# Patient Record
Sex: Female | Born: 1980 | Race: Black or African American | Hispanic: No | Marital: Single | Smoking: Never smoker
Health system: Southern US, Community
[De-identification: ages and names within clinical notes are randomized; demographics above are authoritative.]

---

## 2013-10-07 ENCOUNTER — Encounter: Payer: Self-pay | Admitting: Podiatry

## 2013-10-07 ENCOUNTER — Ambulatory Visit (INDEPENDENT_AMBULATORY_CARE_PROVIDER_SITE_OTHER): Payer: 59 | Admitting: Podiatry

## 2013-10-07 ENCOUNTER — Ambulatory Visit (INDEPENDENT_AMBULATORY_CARE_PROVIDER_SITE_OTHER): Payer: 59

## 2013-10-07 VITALS — BP 130/88 | HR 68 | Resp 14 | Ht 65.0 in | Wt 182.0 lb

## 2013-10-07 DIAGNOSIS — M779 Enthesopathy, unspecified: Secondary | ICD-10-CM

## 2013-10-07 MED ORDER — MELOXICAM 15 MG PO TABS: 15.0000 mg | ORAL_TABLET | Freq: Every day | ORAL | Status: AC

## 2013-10-07 MED ORDER — METHYLPREDNISOLONE (PAK) 4 MG PO TABS: ORAL_TABLET | ORAL | Status: AC

## 2013-10-07 NOTE — Progress Notes (Signed)
   Subjective:    Patient ID: Nicole Mcguire, female    DOB: 30-Jun-1980, 33 y.o.   MRN: [id number]  HPI Comments: Pt states she has had posterior left heel pain for 3 months, and that it is worse in the morning.  Pt states she has occasionally used ice and Ibuprofen for comfort.     Review of Systems  HENT: Positive for sinus pressure and trouble swallowing.   All other systems reviewed and are negative.      Objective:   Physical Exam: I have reviewed his past mental history medications allergies surgeries social history and review of systems. Pulses are strongly palpable bilateral. Deep tendon reflexes are intact bilateral. Neurologic sensorium is intact per since once the monofilament. Muscle strength is 5 over 5 dorsiflexors plantar flexors inverters everters all of his musculature is intact. Orthopedic evacuation demonstrates pain on palpation of the posterior aspect of the left heel at the tendo Achilles insertion site. There is no erythema edema saline is drainage or odor in this area. She has tenderness on palpation at the distal most insertion site of the Achilles. Radiographic evaluation demonstrates small retrocalcaneal heel spur and thickening of the tendo Achilles.        Assessment & Plan:  Assessment: Achilles tendinitis left.  Plan: Discussed etiology pathology conservative versus surgical therapies. Injected the area today with dexamethasone and local anesthetic 2 mg was utilized. I placed her in a Cam Walker for daytime use and a night splint for bedtime use. I wrote a prescription for Medrol Dosepak to be followed by meloxicam. And I will followup with her in one month.

## 2013-11-04 ENCOUNTER — Ambulatory Visit (INDEPENDENT_AMBULATORY_CARE_PROVIDER_SITE_OTHER): Payer: 59 | Admitting: Podiatry

## 2013-11-04 ENCOUNTER — Encounter: Payer: Self-pay | Admitting: Podiatry

## 2013-11-04 VITALS — BP 128/82 | HR 74 | Resp 12

## 2013-11-04 DIAGNOSIS — M7660 Achilles tendinitis, unspecified leg: Secondary | ICD-10-CM

## 2013-11-05 NOTE — Progress Notes (Signed)
She presents today wearing her Cam Dan HumphreysWalker and states that after all the medicine and all other conservative therapies she can states that she's only approximately 40% better. She states that she has done everything that we have asked her to do an attempt to better herself.  Objective: Vital signs are stable she is alert and oriented x3. With the Cam Dan HumphreysWalker was removed she still has pain on palpation of the posterior aspect of the tendo Achilles as it inserts just on the superiormost portion of the calcaneus posteriorly. There is mild erythema and warmth on palpation.  Assessment: Insertional Achilles tendinitis left heel. Possible split tear or transverse tear of the Achilles.  Plan: She was scheduled for an MRI of this left heel. Should this come back with simple Achilles tendinitis physical therapy will be indicated otherwise a tear would indicate surgical necessity.

## 2013-11-08 ENCOUNTER — Ambulatory Visit
Admission: RE | Admit: 2013-11-08 | Discharge: 2013-11-08 | Disposition: A | Discharge status: Home / Self Care | Payer: 59 | Source: Ambulatory Visit | Attending: Podiatry | Admitting: Podiatry

## 2013-11-08 DIAGNOSIS — M7660 Achilles tendinitis, unspecified leg: Secondary | ICD-10-CM

## 2013-11-10 ENCOUNTER — Telehealth: Payer: Self-pay | Admitting: *Deleted

## 2013-11-10 NOTE — Telephone Encounter (Signed)
Spoke to patient , explained to her that dr Al Corpushyatt wanted another read on the mri, explain process and told patient we will call her as soon as we get results

## 2013-11-11 ENCOUNTER — Telehealth: Payer: Self-pay | Admitting: *Deleted

## 2013-11-11 NOTE — Telephone Encounter (Signed)
I called and left her a message that Dr. Al CorpusHyatt got her MRI results back.  He wants to get a second opinion from another Radiologist, so we are sending the disk to [facility].  We will call you once we get the results.

## 2013-11-11 NOTE — Telephone Encounter (Signed)
[facility] disk and results sent to us.  She tried to connect me to medical records at [address] location but they didn't answer.  She said she would give them the message.

## 2013-11-22 ENCOUNTER — Telehealth: Payer: Self-pay | Admitting: *Deleted

## 2013-11-22 NOTE — Telephone Encounter (Signed)
MRI disk and results were sent to [facility] for a second opinion.  [facility] [address]  [place], [place] [address]

## 2013-11-26 ENCOUNTER — Encounter: Payer: Self-pay | Admitting: Podiatry

## 2013-11-26 ENCOUNTER — Telehealth: Payer: Self-pay

## 2013-11-26 NOTE — Telephone Encounter (Signed)
Left message for patient to schedule an appointment for her MRI results.

## 2013-12-09 ENCOUNTER — Ambulatory Visit: Payer: 59 | Admitting: Podiatry

## 2013-12-16 ENCOUNTER — Ambulatory Visit: Payer: 59 | Admitting: Podiatry

## 2013-12-28 ENCOUNTER — Ambulatory Visit: Payer: 59 | Admitting: Podiatry

## 2014-01-22 ENCOUNTER — Emergency Department (INDEPENDENT_AMBULATORY_CARE_PROVIDER_SITE_OTHER)
Admission: EM | Admit: 2014-01-22 | Discharge: 2014-01-22 | Disposition: A | Discharge status: Home / Self Care | Payer: 59 | Source: Home / Self Care | Attending: Emergency Medicine | Admitting: Emergency Medicine

## 2014-01-22 ENCOUNTER — Encounter (HOSPITAL_COMMUNITY): Payer: Self-pay | Admitting: Emergency Medicine

## 2014-01-22 DIAGNOSIS — B349 Viral infection, unspecified: Secondary | ICD-10-CM

## 2014-01-22 MED ORDER — IPRATROPIUM BROMIDE 0.06 % NA SOLN: 2.0000 | Freq: Four times a day (QID) | NASAL | Status: AC

## 2014-01-22 MED ORDER — ONDANSETRON HCL 4 MG PO TABS: 4.0000 mg | ORAL_TABLET | Freq: Three times a day (TID) | ORAL | Status: AC | PRN

## 2014-01-22 NOTE — ED Notes (Signed)
C/o 1 week duration of "flu like symptoms", general body aches, reports minimal relief w OTC medications. C/o tody she developed diarrhea. Denies pain

## 2014-01-22 NOTE — Discharge Instructions (Signed)
You have a viral illness. Make sure you drink plenty of fluids. Use tylenol or motrin for body aches and headache. Take zofran every 8 hours as needed for nausea. Use the Atrovent nasal spray 4 times a day. Use nasal saline spray as often as you can. If you are still having diarrhea on Monday, you can take Immodium.  If you develop fevers, are unable to keep fluids down, see blood in the diarrhea or are just not improving, please see your regular doctor. Follow up with your regular doctor in 1-2 weeks to make sure everything has resolved.

## 2014-01-22 NOTE — ED Provider Notes (Signed)
CSN: [id number]     Arrival date & time 01/22/14  1651 History   First MD Initiated Contact with Patient 01/22/14 1700     Chief Complaint  Patient presents with  . Generalized Body Aches   (Consider location/radiation/quality/duration/timing/severity/associated sxs/prior Treatment) HPI She is a 33 year old woman here for evaluation of body aches. She reports body aches for about 1 week. She also reports watery diarrhea for the last 2 days. There is no blood in the stool. She has had 4 bowel movements today. She also has sinus congestion and some nausea. She has decreased appetite, but is able to tolerate fluids. She has a mild cough. Also reports mild diffuse abdominal pain. Denies fevers.  History reviewed. No pertinent past medical history. History reviewed. No pertinent past surgical history. Family History  Problem Relation Age of Onset  . Diabetes Father    History  Substance Use Topics  . Smoking status: Never Smoker   . Smokeless tobacco: Not on file  . Alcohol Use: Not on file   OB History   Grav Para Term Preterm Abortions TAB SAB Ect Mult Living                 Review of Systems  Constitutional: Positive for chills and fatigue. Negative for fever.  HENT: Positive for sinus pressure. Negative for congestion, rhinorrhea and sore throat.   Respiratory: Positive for cough. Negative for shortness of breath.   Gastrointestinal: Positive for nausea, abdominal pain and diarrhea. Negative for vomiting and blood in stool.  Genitourinary: Negative.   Musculoskeletal: Positive for myalgias.  Neurological: Positive for headaches.    Allergies  Penicillins  Home Medications   Prior to Admission medications   Medication Sig Start Date End Date Taking? Authorizing Provider  ipratropium (ATROVENT) 0.06 % nasal spray Place 2 sprays into both nostrils 4 (four) times daily. 01/22/14   Charm RingsErin J Val Schiavo, MD  meloxicam (MOBIC) 15 MG tablet Take 1 tablet (15 mg total) by mouth daily.  10/07/13   Max T Hyatt, DPM  methylPREDNIsolone (MEDROL DOSPACK) 4 MG tablet follow package directions 10/07/13   Max T Hyatt, DPM  ondansetron (ZOFRAN) 4 MG tablet Take 1 tablet (4 mg total) by mouth every 8 (eight) hours as needed for nausea or vomiting. 01/22/14   Charm RingsErin J Bralyn Espino, MD   BP 139/94  Pulse 93  Temp(Src) 98.3 F (36.8 C) (Oral)  Resp 18  SpO2 94% Physical Exam  Constitutional: She is oriented to person, place, and time. She appears well-developed and well-nourished.  Appears tired  HENT:  Head: Normocephalic and atraumatic.  Right Ear: External ear normal.  Left Ear: External ear normal.  Nose: Mucosal edema present. Right sinus exhibits maxillary sinus tenderness and frontal sinus tenderness. Left sinus exhibits maxillary sinus tenderness and frontal sinus tenderness.  Mouth/Throat: Oropharynx is clear and moist.  Eyes: Conjunctivae are normal.  Neck: Neck supple.  Cardiovascular: Normal rate, regular rhythm and normal heart sounds.   No murmur heard. Pulmonary/Chest: Effort normal and breath sounds normal. No respiratory distress. She has no wheezes. She has no rales.  Abdominal: Soft. Bowel sounds are normal. She exhibits no distension. There is tenderness (mild diffuse). There is no rebound and no guarding.  Lymphadenopathy:    She has no cervical adenopathy.  Neurological: She is alert and oriented to person, place, and time.  Skin: Skin is warm and dry.    ED Course  Procedures (including critical care time) Labs Review Labs Reviewed - No data  to display  Imaging Review No results found.   MDM   1. Viral illness    Discussed symptomatic treatment as in after visit summary. Atrovent nasal spray and saline nasal spray for sinus congestion. Zofran for nausea. Tylenol and Motrin for headache and body aches. Imodium for diarrhea starting on Monday. Reviewed reasons to return. Follow-up with PCP in 1-2 weeks or sooner as needed.    Charm RingsErin J Deaaron Fulghum,  MD 01/22/14 [phone]

## 2016-07-14 IMAGING — MR MR FOOT*L* W/O CM
4 of 5 series · 19 of 40 positions shown · non-contrast
Comparison: 10/07/2013.

CLINICAL DATA: LEFT heel pain for 3 months.  Achilles tendinosis.

EXAM:
MRI OF THE LEFT FOOT WITHOUT CONTRAST
TECHNIQUE: Multiplanar, multisequence MR imaging of the foot was performed. No
intravenous contrast was administered.

[Series 4: T1 · axial · 4.0mm · 0.27mm/px · z∈[-89,+50]mm · 4 of 36 slices shown (1 of 2)]
[im 1/36]
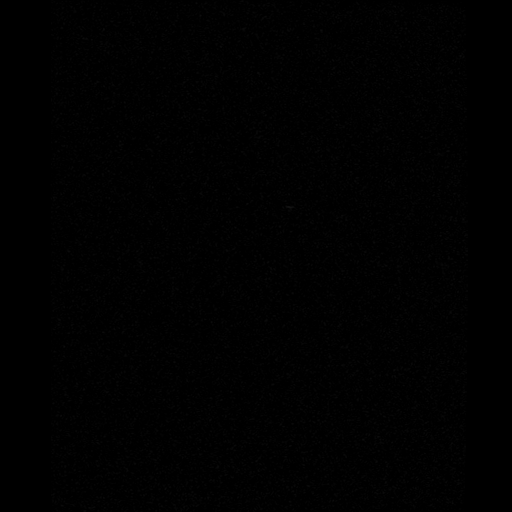
[im 4/36]
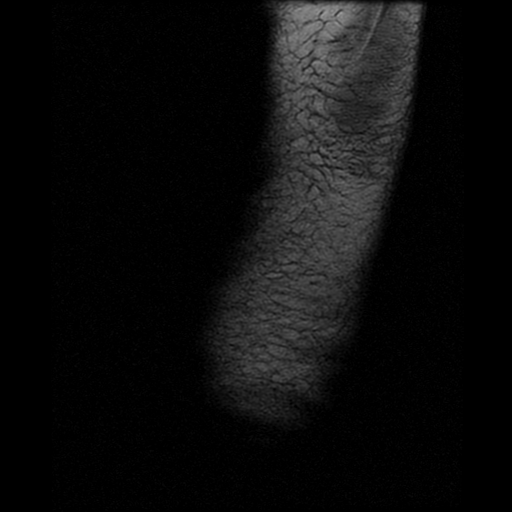
[im 20/36]
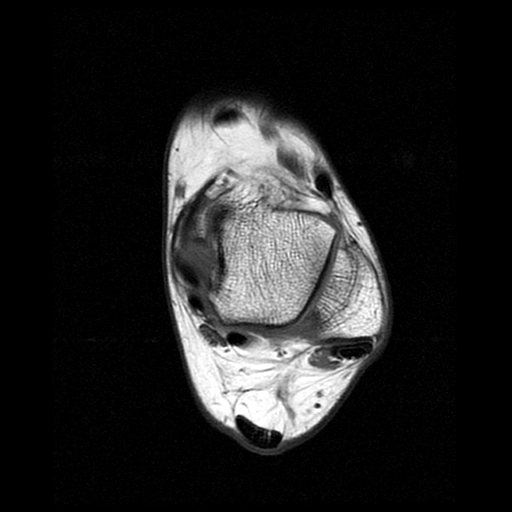
[im 32/36]
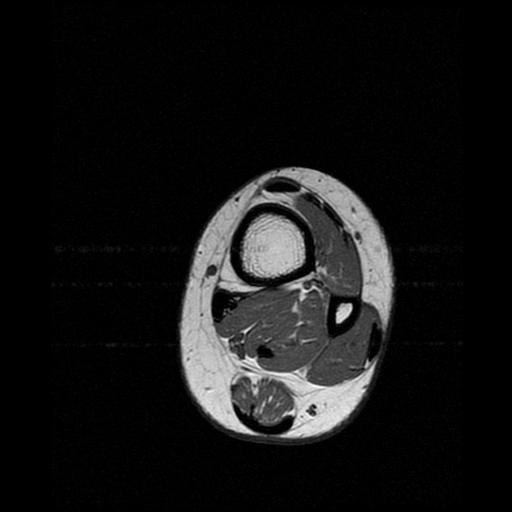

[Series 5: T2 · axial · 4.0mm · 0.27mm/px · z∈[-71,+46]mm · 3 of 36 slices shown]
[im 5/36]
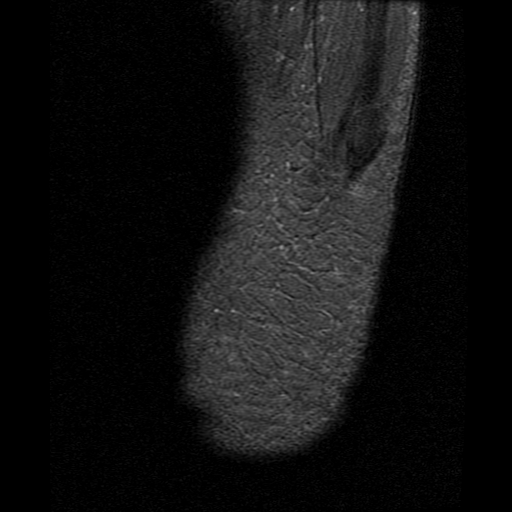
[im 18/36]
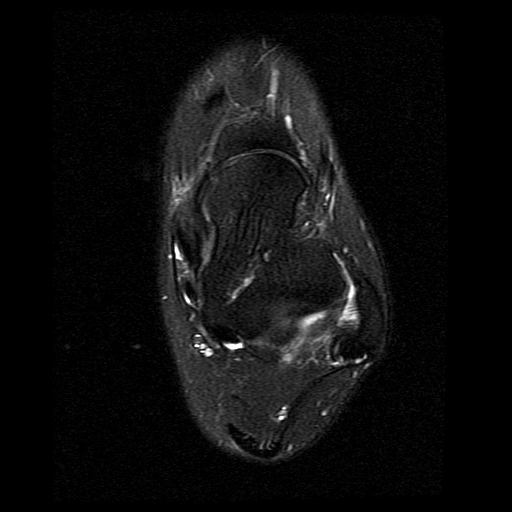
[im 31/36]
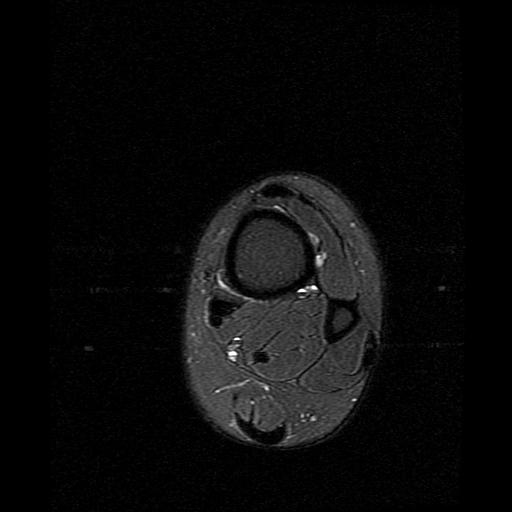

[Series 6: T2 fat-sat · coronal · 3.0mm · 0.31mm/px · 9 of 36 slices shown]
[im 1/36]
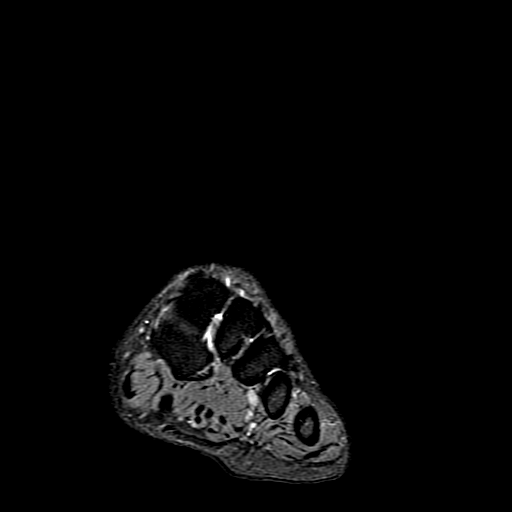
[im 5/36]
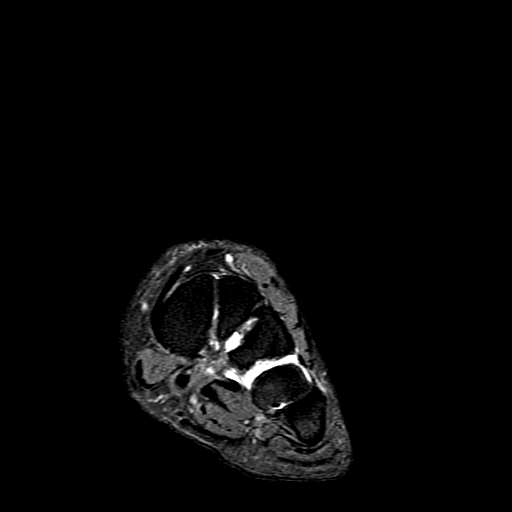
[im 9/36]
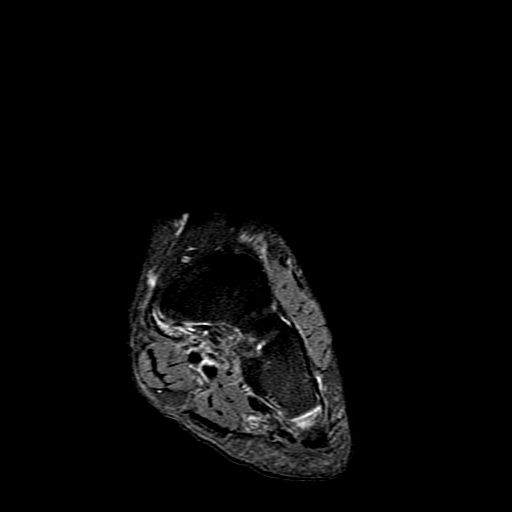
[im 14/36]
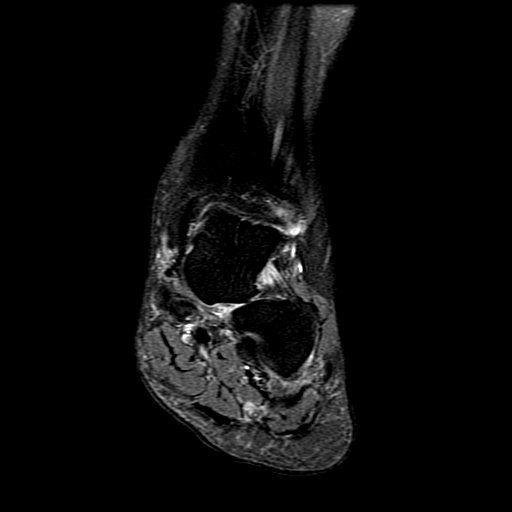
[im 18/36]
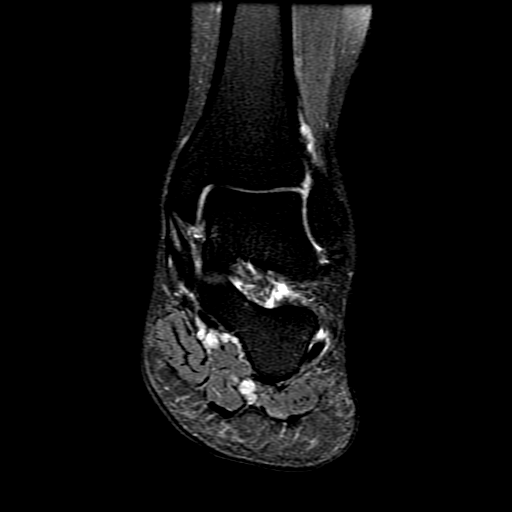
[im 22/36]
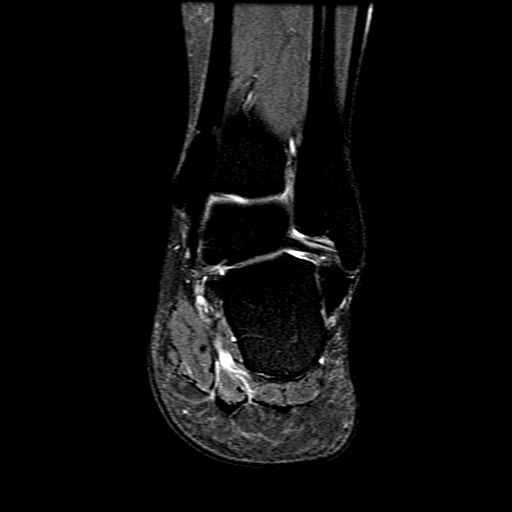
[im 27/36]
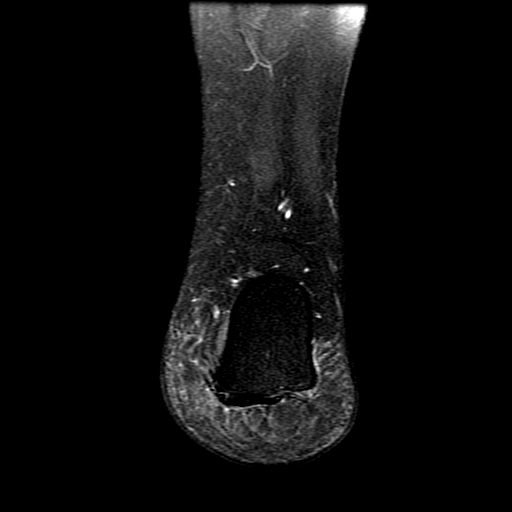
[im 31/36]
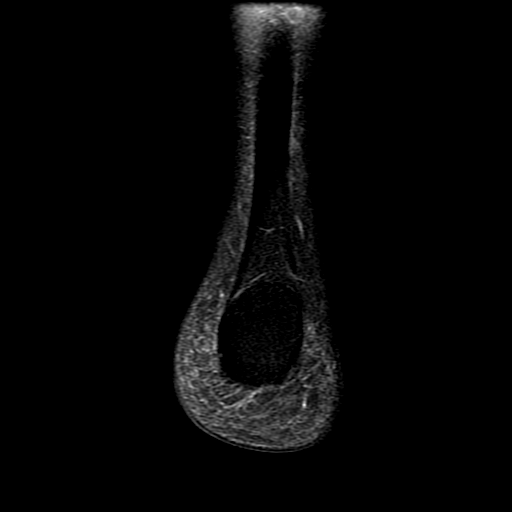
[im 36/36]
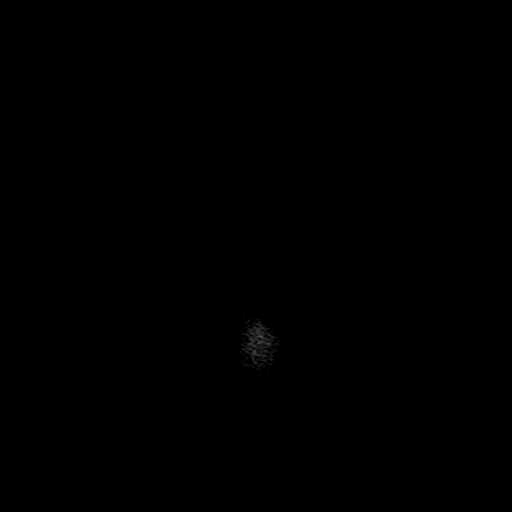

[Series 7: T1 · sagittal · 3.0mm · 0.29mm/px · 3 of 23 slices shown (2 of 2)]
[im 5/23]
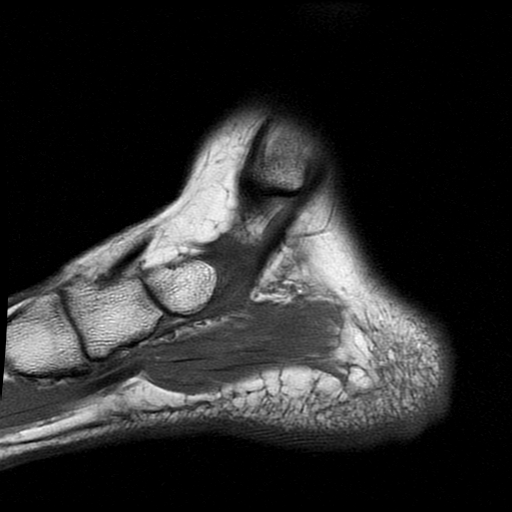
[im 14/23]
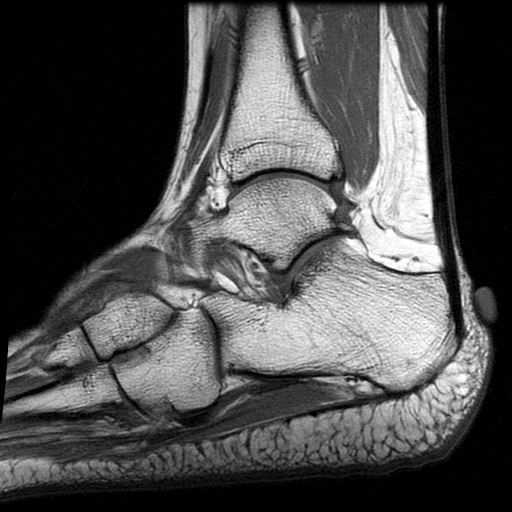
[im 23/23]
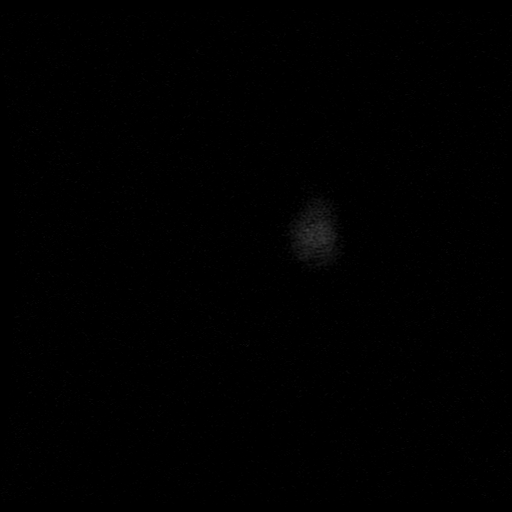

[19 of 40 positions shown; findings below may reference images not displayed]

FINDINGS: TENDONS

Peroneal: Longus and brevis configuration of the peroneal tendons
with mild tenosynovitis adjacent to the peroneal tubercle.

Posteromedial: Normal.

Anterior: Normal.

Achilles: Normal. Pre Achilles bursa is normal. No inflammatory
changes in the pre Achilles fat pad.

Plantar Fascia: Normal.  No thickening or edema.

LIGAMENTS

Lateral: Syndesmotic membrane, tibiofibular and talofibular
ligaments are intact. Calcaneofibular ligament intact.

Medial: Deltoid ligament appears normal.

CARTILAGE

Ankle Joint: There is no osteochondral lesion of the talar dome.
Mild chondromalacia of the medial talus adjacent to the medial
malleolus. Small subchondral cyst.

Subtalar Joints/Sinus Tarsi: Normal.

Bones: Normal.
IMPRESSION: 1. Normal Achilles tendon.  No Achilles tendinosis or paratenonitis.
2. Normal appearance of the plantar fascia.
3. Mild peroneal tenosynovitis.
4. Chondromalacia of the nonweightbearing medial talus adjacent to
the medial malleolus.

## 2019-12-27 ENCOUNTER — Other Ambulatory Visit: Payer: Self-pay | Admitting: Otolaryngology

## 2019-12-27 DIAGNOSIS — J329 Chronic sinusitis, unspecified: Secondary | ICD-10-CM

## 2020-10-05 ENCOUNTER — Other Ambulatory Visit: Payer: Self-pay

## 2020-10-05 ENCOUNTER — Ambulatory Visit
Admission: EM | Admit: 2020-10-05 | Discharge: 2020-10-05 | Disposition: A | Discharge status: Home / Self Care | Payer: 59 | Attending: Emergency Medicine | Admitting: Emergency Medicine

## 2020-10-05 ENCOUNTER — Encounter: Payer: Self-pay | Admitting: Emergency Medicine

## 2020-10-05 DIAGNOSIS — R058 Other specified cough: Secondary | ICD-10-CM | POA: Diagnosis not present

## 2020-10-05 MED ORDER — BENZONATATE 200 MG PO CAPS: 200.0000 mg | ORAL_CAPSULE | Freq: Three times a day (TID) | ORAL | 0 refills | Status: AC | PRN

## 2020-10-05 MED ORDER — DOXYCYCLINE HYCLATE 100 MG PO CAPS: 100.0000 mg | ORAL_CAPSULE | Freq: Two times a day (BID) | ORAL | 0 refills | Status: AC

## 2020-10-05 MED ORDER — PREDNISONE 50 MG PO TABS: 50.0000 mg | ORAL_TABLET | Freq: Every day | ORAL | 0 refills | Status: AC

## 2020-10-05 NOTE — Discharge Instructions (Addendum)
Begin doxycycline twice daily x1 week Prednisone 50 mg daily x5 days-take with food and earlier in the day if possible Tessalon every 8 hours for cough Continue to use Mucinex Rest and fluids Follow-up if not improving or worsening

## 2020-10-05 NOTE — ED Triage Notes (Signed)
Patient presents for persistent cough since having COVID 3 weeks ago.  Denies worsening, denies SOB

## 2020-10-05 NOTE — ED Provider Notes (Signed)
[facility]    CSN: [id number] Arrival date & time: 10/05/20  1059      History   Chief Complaint Chief Complaint  Patient presents with   Cough    HPI Nicole Mcguire is a 40 y.o. female presenting today for evaluation of URI symptoms.  Reports associated sore throat and cough.  Reports that she tested positive for COVID approximately 3 weeks ago, initially had fevers, but this is resolved.  Continues to have cough congestion and sore throat.  Denies chest pain or shortness of breath.  Using Mucinex, DayQuil, NyQuil without relief.  Denies history of asthma, smoking, COPD.  HPI  History reviewed. No pertinent past medical history.  There are no problems to display for this patient.   History reviewed. No pertinent surgical history.  OB History   No obstetric history on file.      Home Medications    Prior to Admission medications   Medication Sig Start Date End Date Taking? Authorizing Provider  benzonatate (TESSALON) 200 MG capsule Take 1 capsule (200 mg total) by mouth 3 (three) times daily as needed for up to 7 days for cough. 10/05/20 10/12/20 Yes Aum Caggiano C, PA-C  doxycycline (VIBRAMYCIN) 100 MG capsule Take 1 capsule (100 mg total) by mouth 2 (two) times daily for 7 days. 10/05/20 10/12/20 Yes Destinie Thornsberry C, PA-C  predniSONE (DELTASONE) 50 MG tablet Take 1 tablet (50 mg total) by mouth daily for 5 days. 10/05/20 10/10/20 Yes Jemeka Wagler C, PA-C  ipratropium (ATROVENT) 0.06 % nasal spray Place 2 sprays into both nostrils 4 (four) times daily. 01/22/14   Charm Rings, MD  meloxicam (MOBIC) 15 MG tablet Take 1 tablet (15 mg total) by mouth daily. 10/07/13   Hyatt, Max T, DPM  methylPREDNIsolone (MEDROL DOSPACK) 4 MG tablet follow package directions 10/07/13   Hyatt, Max T, DPM  ondansetron (ZOFRAN) 4 MG tablet Take 1 tablet (4 mg total) by mouth every 8 (eight) hours as needed for nausea or vomiting. 01/22/14   Charm Rings, MD    Family  History Family History  Problem Relation Age of Onset   Hypertension Mother    Hypertension Father    Diabetes Father     Social History Social History   Tobacco Use   Smoking status: Never   Smokeless tobacco: Never  Substance Use Topics   Alcohol use: Never   Drug use: Never     Allergies   Penicillins   Review of Systems Review of Systems  Constitutional:  Negative for activity change, appetite change, chills, fatigue and fever.  HENT:  Positive for congestion and sore throat. Negative for ear pain, rhinorrhea, sinus pressure and trouble swallowing.   Eyes:  Negative for discharge and redness.  Respiratory:  Positive for cough. Negative for chest tightness and shortness of breath.   Cardiovascular:  Negative for chest pain.  Gastrointestinal:  Negative for abdominal pain, diarrhea, nausea and vomiting.  Musculoskeletal:  Negative for myalgias.  Skin:  Negative for rash.  Neurological:  Negative for dizziness, light-headedness and headaches.    Physical Exam Triage Vital Signs ED Triage Vitals  Enc Vitals Group     BP      Pulse      Resp      Temp      Temp src      SpO2      Weight      Height      Head Circumference  Peak Flow      Pain Score      Pain Loc      Pain Edu?      Excl. in GC?    No data found.  Updated Vital Signs BP 129/89 (BP Location: Right Arm)   Pulse 73   Temp 98.8 F (37.1 C) (Oral)   Resp 18   LMP 09/25/2020   SpO2 96%   Visual Acuity Right Eye Distance:   Left Eye Distance:   Bilateral Distance:    Right Eye Near:   Left Eye Near:    Bilateral Near:     Physical Exam Vitals and nursing note reviewed.  Constitutional:      Appearance: She is well-developed.     Comments: No acute distress  HENT:     Head: Normocephalic and atraumatic.     Ears:     Comments: Bilateral ears without tenderness to palpation of external auricle, tragus and mastoid, EAC's without erythema or swelling, TM's with good bony  landmarks and cone of light. Non erythematous.      Nose: Nose normal.     Mouth/Throat:     Comments: Oral mucosa pink and moist, no tonsillar enlargement or exudate. Posterior pharynx patent and nonerythematous, no uvula deviation or swelling. Normal phonation.  Eyes:     Conjunctiva/sclera: Conjunctivae normal.  Cardiovascular:     Rate and Rhythm: Normal rate and regular rhythm.  Pulmonary:     Effort: Pulmonary effort is normal. No respiratory distress.     Comments: Breathing comfortably at rest, CTABL, no wheezing, rales or other adventitious sounds auscultated   Abdominal:     General: There is no distension.  Musculoskeletal:        General: Normal range of motion.     Cervical back: Neck supple.  Skin:    General: Skin is warm and dry.  Neurological:     Mental Status: She is alert and oriented to person, place, and time.     UC Treatments / Results  Labs (all labs ordered are listed, but only abnormal results are displayed) Labs Reviewed - No data to display  EKG   Radiology No results found.  Procedures Procedures (including critical care time)  Medications Ordered in UC Medications - No data to display  Initial Impression / Assessment and Plan / UC Course  I have reviewed the triage vital signs and the nursing notes.  Pertinent labs & imaging results that were available during my care of the patient were reviewed by me and considered in my medical decision making (see chart for details).     Suspect likely postviral cough secondary to COVID-given symptoms x3 weeks covering with doxycycline to cover atypicals/sinusitis, prednisone x5 days, Tessalon to add in with Mucinex rest and fluids.  Discussed strict return precautions. Patient verbalized understanding and is agreeable with plan.  Final Clinical Impressions(s) / UC Diagnoses   Final diagnoses:  Post-viral cough syndrome     Discharge Instructions      Begin doxycycline twice daily x1  week Prednisone 50 mg daily x5 days-take with food and earlier in the day if possible Tessalon every 8 hours for cough Continue to use Mucinex Rest and fluids Follow-up if not improving or worsening     ED Prescriptions     Medication Sig Dispense Auth. Provider   doxycycline (VIBRAMYCIN) 100 MG capsule Take 1 capsule (100 mg total) by mouth 2 (two) times daily for 7 days. 14 capsule Zailey Audia, [place] C,  PA-C   predniSONE (DELTASONE) 50 MG tablet Take 1 tablet (50 mg total) by mouth daily for 5 days. 5 tablet Ulmer Degen C, PA-C   benzonatate (TESSALON) 200 MG capsule Take 1 capsule (200 mg total) by mouth 3 (three) times daily as needed for up to 7 days for cough. 28 capsule Shacora Zynda, [place] C, PA-C      PDMP not reviewed this encounter.   Krystie Leiter, [place] C, PA-C 10/05/20 1140

## 2022-07-01 ENCOUNTER — Other Ambulatory Visit (HOSPITAL_COMMUNITY): Payer: Self-pay | Admitting: Internal Medicine

## 2022-07-01 DIAGNOSIS — E042 Nontoxic multinodular goiter: Secondary | ICD-10-CM

## 2022-07-01 DIAGNOSIS — E041 Nontoxic single thyroid nodule: Secondary | ICD-10-CM

## 2022-07-10 NOTE — Progress Notes (Signed)
Nicole Dance, MD  Georgeann Oppenheim for [place] FNA 2.2cm RT inferior thyroid TR4 nodule.  See outside [place] loaded in clario  Outside report is in the scanned media doc on pg. 24!  Its in there!  TS

## 2022-07-24 ENCOUNTER — Ambulatory Visit (HOSPITAL_COMMUNITY)
Admission: RE | Admit: 2022-07-24 | Discharge: 2022-07-24 | Disposition: A | Discharge status: Home / Self Care | Payer: No Typology Code available for payment source | Source: Ambulatory Visit | Attending: Internal Medicine | Admitting: Internal Medicine

## 2022-07-24 DIAGNOSIS — E041 Nontoxic single thyroid nodule: Secondary | ICD-10-CM | POA: Diagnosis not present

## 2022-07-24 DIAGNOSIS — E042 Nontoxic multinodular goiter: Secondary | ICD-10-CM | POA: Diagnosis present

## 2022-07-24 MED ORDER — LIDOCAINE HCL (PF) 1 % IJ SOLN
10.0000 mL | Freq: Once | INTRAMUSCULAR | Status: AC
  Administered 2022-07-24: 10 mL via INTRADERMAL

## 2022-07-25 LAB — CYTOLOGY - NON PAP

## 2023-10-10 NOTE — Progress Notes (Signed)
 [address] - AMBULATORY [facility]  - INTERNAL MEDICINE [facility] [address] [place] [id number]    Name: Nicole Mcguire DOB: [birth year]   October 10, 2023  Patient ID: Nicole Mcguire is a 43 y.o. female is here for Establish Care   HPI: This is my 1st encounter with Nicole Mcguire, who is a 43 y.o. African [place] female with a past medical history of tinnitus, vitamin D deficiency, plantar fasciitis, musculoskeletal injury, migraines, degeneration of the cervical disc, abnormal Pap smear, hyperlipidemia, thyroid  disease.  Patient has a strong family history of colon cancer, colon polyps, CAD.SABRA Former Electronics engineer. [facility]   Last CPE: June 2024 Last eye exam: up to date- wears glasses Last dental exam: up to date Last skin exam: no issues Exercise: yes- 2 days a week Diet: bad diet  Last pap: 01/2022 Result: normal Due: 2028 G0P0 LMP: IUD  Last Mammogram: 03/2022 Result: normal Sexually active: yes with female Protection: occasional- condoms Desires STD testing: no  Last Colonoscopy/Cologuard: 2024- at the [facility] Results: normal Due: 2029- due to family history  Immunization History  Administered Date(s) Administered  . Anthrax 07/18/2005, 08/06/2005, 08/24/2005, 10/03/2007, 12/23/2008  . H1n1 All Forms 04/25/2008  . HPV 9-Valent 05/02/2021, 09/12/2022, 01/27/2023  . Hep B, Adolescent or Pediatric 08/15/2003, 09/29/2003, 05/24/2004  . Hepatitis A pediatric/adolescent ([facility] PEDS) 1Y-18Y 05/24/2004, 07/18/2005  . IPV 08/15/2003  . Influenza LAIV (Nasal) 12/26/2003, 12/30/2009  . Influenza Split 08/15/2003, 02/01/2005, 01/30/2007, 12/23/2008  . Influenza, Injectable, Quadrivalent, Preservative Free 03/01/2021  . Influenza, Unspecified 08/15/2003, 12/26/2003, 02/01/2005, 01/22/2006, 01/30/2007, 04/25/2008, 12/23/2008, 12/30/2009, 12/24/2018  . Influenza, high-dose, trivalent, PF 06/05/2020  . MMR 08/15/2003  . Meningococcal  Polysaccharide 08/15/2003  . Moderna SARS-CoV-2 Primary Series 12+ yrs 05/31/2019, 06/28/2019, 07/30/2019, 02/07/2020  . Novel Influenza H1n1-09 04/03/2008  . Polio, Unspecified 08/15/2003  . Smallpox 09/20/2004  . TD (Adult), 2 Lf Tetanus Toxoid, Preservative Free 08/15/2003  . TD vaccine (ADULT)PF(TENIVAC) 7Y+ 08/15/2003, 11/27/2018  . TDAP VACCINE (BOOSTRIX,ADACEL) 7Y+ 12/23/2008, 06/04/2023  . Td (Adult), Unspecified 11/27/2018  . Typhoid Inactivated 07/28/2004, 07/25/2007   Last Tdap: 2025 Flu Vaccine: not indicated  Smoker: no Alcohol: no Power of attorney: no Living will: no  Little interest or pleasure in doing things: not at all.  Feeling down, depressed or hopeless: not at all.   ROS   Constitutional symptoms: negative Eyes:  negative Ear, nose, throat:  negative Cardiovascular:  negative Respiratory:  negative Gastrointestinal:  negative Genitourinary:  negative Skin:  negative Neurological:  negative Musculoskeletal:  negative Psychiatric:  negative Endocrine:  negative Hematological:  negative Allergic:  negative    Assessment/Plan:    Nicole Mcguire was seen today for establish care.  Diagnoses and all orders for this visit:  Annual physical exam Advised annual eye, dental, and skin exams. Encouraged 150 minutes of heart increasing exercise per week, adequate water intake, balanced meals, and mantaining a healthy BMI (20-25). See patient instructions.  Routine Labs ordered Health Screenings:  Colonoscopy due: 2029  Mammogram Due: ordered  Pap Smear Due: 2028  Dexa Scan Due: age 61 Vaccines up to date -     MG Breast Screening Tomo Bilat; Future -     CBC with Differential; Future -     Comprehensive Metabolic Panel; Future -     Lipid Panel; Future -     TSH; Future -     Vitamin D, 25-Hydroxy; Future  Encounter for screening mammogram for malignant neoplasm  of breast -     MG Breast Screening Tomo Bilat; Future  Screening for lipid  disorders Mixed hyperlipidemia We will check a lipid-fasting Was taking simvastatin-had myalgias Educated her to work on limiting red meat, fried and fatty foods-increase exercise The 10-year ASCVD risk score (Arnett DK, et al., 2019) is: 2.4%   Values used to calculate the score:     Age: 25 years     Clincally relevant sex: Female     Is Non-Hispanic African American: Yes     Diabetic: No     Tobacco smoker: No     Systolic Blood Pressure: 139 mmHg     Is BP treated: No     HDL Cholesterol: 40 mg/dL     Total Cholesterol: 239 mg/dL -     Lipid Panel; Future  Vitamin D deficiency Last vitamin D level was 15-taking supplement once a week -     Vitamin D, 25-Hydroxy; Future  Patient verbalizes understanding and in agreement with the above plan. All questions answered.   Medication side effects discussed with patient. Advised patient to call clinic or return for visit if symptoms occur.  Goals of care discussed with patient including med compliance and adequate follow up.  Return in about 6 months (around 04/11/2024), or HLD.   Physical Exam    Vital Signs  Vitals:   10/10/23 0931 10/10/23 1000  BP: (!) 139/96 110/68  BP Location:  Left arm  Patient Position:  Lying  Pulse: 103   SpO2: 97%   Weight: 100 kg (220 lb 9.6 oz)   Height: 1.626 m (5' 4)      Physical Exam Vitals reviewed.  Constitutional:      General: She is awake. She is not in acute distress.    Appearance: Normal appearance. She is well-developed and well-groomed. She is obese.  HENT:     Head: Normocephalic.     Jaw: There is normal jaw occlusion.     Salivary Glands: Right salivary gland is not diffusely enlarged or tender. Left salivary gland is not diffusely enlarged or tender.     Right Ear: Hearing, tympanic membrane, ear canal and external ear normal.     Left Ear: Hearing, tympanic membrane, ear canal and external ear normal.     Nose: Nose normal.     Right Sinus: No maxillary sinus  tenderness or frontal sinus tenderness.     Left Sinus: No maxillary sinus tenderness or frontal sinus tenderness.     Mouth/Throat:     Lips: Pink.     Mouth: Mucous membranes are moist.     Dentition: Normal dentition.     Pharynx: Oropharynx is clear. Uvula midline.     Tonsils: No tonsillar exudate.   Eyes:     General: Lids are normal. Lids are everted, no foreign bodies appreciated. Vision grossly intact. Gaze aligned appropriately. No allergic shiner.    Extraocular Movements: Extraocular movements intact.     Conjunctiva/sclera: Conjunctivae normal.     Pupils: Pupils are equal, round, and reactive to light.   Neck:     Thyroid : No thyroid  mass, thyromegaly or thyroid  tenderness.     Vascular: Normal carotid pulses. No carotid bruit.     Trachea: Trachea normal.   Cardiovascular:     Rate and Rhythm: Normal rate and regular rhythm. No extrasystoles are present.    Chest Wall: PMI is not displaced.     Pulses: Normal pulses.  Radial pulses are 2+ on the right side and 2+ on the left side.       Posterior tibial pulses are 2+ on the right side and 2+ on the left side.     Heart sounds: Normal heart sounds. No murmur heard. Pulmonary:     Effort: Pulmonary effort is normal. No tachypnea, accessory muscle usage or respiratory distress.     Breath sounds: Normal breath sounds and air entry. No stridor, decreased air movement or transmitted upper airway sounds.  Abdominal:     General: Abdomen is protuberant. Bowel sounds are normal. There is no distension.     Palpations: Abdomen is soft. There is no hepatomegaly, splenomegaly or mass.     Tenderness: There is no abdominal tenderness. There is no right CVA tenderness or left CVA tenderness.     Hernia: No hernia is present.   Musculoskeletal:     Cervical back: Full passive range of motion without pain and normal range of motion.     Right lower leg: No edema.     Left lower leg: No edema.     Comments: 5/5 strength  in bilateral upper and lower extremities  Lymphadenopathy:     Head:     Right side of head: No submental, submandibular, tonsillar, preauricular, posterior auricular or occipital adenopathy.     Left side of head: No submental, submandibular, tonsillar, preauricular, posterior auricular or occipital adenopathy.     Cervical: No cervical adenopathy.     Right cervical: No superficial, deep or posterior cervical adenopathy.    Left cervical: No superficial, deep or posterior cervical adenopathy.   Skin:    General: Skin is warm.     Capillary Refill: Capillary refill takes 2 to 3 seconds.     Findings: No rash.   Neurological:     Mental Status: She is alert and oriented to person, place, and time. Mental status is at baseline.     GCS: GCS eye subscore is 4. GCS verbal subscore is 5. GCS motor subscore is 6.     Gait: Gait is intact.     Deep Tendon Reflexes:     Reflex Scores:      Patellar reflexes are 2+ on the right side and 2+ on the left side.  Psychiatric:        Attention and Perception: Attention normal.        Mood and Affect: Mood and affect normal.        Speech: Speech normal.        Behavior: Behavior normal. Behavior is cooperative.        Thought Content: Thought content normal.        Cognition and Memory: Cognition normal.        Judgment: Judgment normal.       Medical History    Allergies Allergies[1]  Medications Current Medications[2]  Medical history Medical History[3]  Surgical History Surgical History[4]  Social History Tobacco Use History[5]  Family History Family History[6]  I have reviewed and (if needed) updated the patient's problem list, medications, allergies, past medical and surgical history, social and family history.  Portions of this note were dictated using DRAGON voice recognition software. Please disregard any errors in transcription. This record has been created using Conservation officer, historic buildings. Errors have been  sought and corrected, but may not always be located. Such creation errors do not reflect on the standard of medical care.  Electronically signed by: Loria Stephane Penton, NP 10/10/2023  9:52 AM       [1] Allergies Allergen Reactions  . Amoxicillin Hives and Shortness Of Breath  . Penicillin Anaphylaxis  . Penicillins Hives and Shortness Of Breath  [2] Current Outpatient Medications  Medication Sig Dispense Refill  . azelastine (ASTELIN) 137 mcg (0.1 %) nasal spray     . cetirizine (ZyrTEC) 10 mg tablet Take 1 tablet by mouth Once Daily.    . ergocalciferol (VITAMIN D2) 1,250 mcg (50,000 unit) capsule Take 1,250 mcg by mouth.    . fluticasone propionate (FLONASE) 50 mcg/spray nasal spray 1-2 SPRAYS NASALLY ONCE A DAY 30 DAYS    . levonorgestreL (MIRENA) 21 mcg/24 hr (8 yrs) 52 mg IUD 1 Application by intrauterine route.    . montelukast (SINGULAIR) 10 mg tablet 1 tablet.    . SUMAtriptan (IMITREX) 50 mg tablet Take 1 tablet (50 mg total) by mouth every 2 (two) hours as needed for migraine. 10 tablet 2  . fluconazole (Diflucan) 150 mg tablet Take 1 tablet (150 mg total) by mouth every other day. (Patient not taking: Reported on 10/10/2023) 3 tablet 0  . metroNIDAZOLE (METROGEL) 0.75 % (37.5mg /5 gram) vaginal gel Insert into the vagina 2 (two) times a day for 7 days. 70 g 1  . simvastatin (ZOCOR) 10 mg tablet Take 1 tablet (10 mg total) by mouth at bedtime. (Patient not taking: Reported on 10/10/2023) 90 tablet 1   No current facility-administered medications for this visit.  [3] Past Medical History: Diagnosis Date  . Allergic   . Allergic rhinitis   . Disease of thyroid  gland   . Headache   . HL (hearing loss)   . Hypercholesterolemia   . Obesity   [4] Past Surgical History: Procedure Laterality Date  . SEPTOPLASTY     Procedure: SEPTOPLASTY  [5] Social History Tobacco Use  Smoking Status Never  Smokeless Tobacco Never  [6] Family History Problem Relation Name Age of Onset   . Hypertension Mother    . Heart disease Father    . Hypertension Father    . Diabetes Father    . Heart attack Father  15  . Colon cancer Father  71  . Heart disease Brother  46  . Heart attack Brother    . Heart disease Brother  59  . Ovarian cancer Other mat aunt 34  . Breast cancer Neg Hx

## 2023-11-03 ENCOUNTER — Other Ambulatory Visit: Payer: Self-pay

## 2023-11-03 ENCOUNTER — Emergency Department (HOSPITAL_BASED_OUTPATIENT_CLINIC_OR_DEPARTMENT_OTHER)

## 2023-11-03 ENCOUNTER — Emergency Department (HOSPITAL_BASED_OUTPATIENT_CLINIC_OR_DEPARTMENT_OTHER)
Admission: EM | Admit: 2023-11-03 | Discharge: 2023-11-03 | Disposition: A | Discharge status: Home / Self Care | Attending: Emergency Medicine | Admitting: Emergency Medicine

## 2023-11-03 ENCOUNTER — Encounter (HOSPITAL_BASED_OUTPATIENT_CLINIC_OR_DEPARTMENT_OTHER): Payer: Self-pay | Admitting: Emergency Medicine

## 2023-11-03 DIAGNOSIS — L03011 Cellulitis of right finger: Secondary | ICD-10-CM | POA: Diagnosis not present

## 2023-11-03 DIAGNOSIS — M79644 Pain in right finger(s): Secondary | ICD-10-CM | POA: Diagnosis present

## 2023-11-03 MED ORDER — IBUPROFEN 400 MG PO TABS
600.0000 mg | ORAL_TABLET | Freq: Once | ORAL | Status: AC
  Administered 2023-11-03 (×2): 600 mg via ORAL
  Filled 2023-11-03: qty 1

## 2023-11-03 MED ORDER — LIDOCAINE HCL (PF) 1 % IJ SOLN
10.0000 mL | Freq: Once | INTRAMUSCULAR | Status: AC
  Administered 2023-11-03 (×2): 10 mL

## 2023-11-03 NOTE — ED Provider Notes (Signed)
 [place] [facility] Provider Note   CSN: [id number] Arrival date & time: 11/03/23  1133     Patient presents with: Hand Pain   Nicole Mcguire is a 43 y.o. female who presents with pain and swelling to the her right middle finger that started 2 days ago.  States she hit her finger on a wall when walking by and since then it has been painful and swollen. Reports diminished sensation in the right middle due to a previous injury.  Denies any new sensation.  Does report difficulty with bending her finger due to pain.    Hand Pain       Prior to Admission medications   Medication Sig Start Date End Date Taking? Authorizing Provider  ipratropium (ATROVENT ) 0.06 % nasal spray Place 2 sprays into both nostrils 4 (four) times daily. 01/22/14   Diedra Rocky PARAS, MD  meloxicam  (MOBIC ) 15 MG tablet Take 1 tablet (15 mg total) by mouth daily. 10/07/13   Hyatt, Max T, DPM  methylPREDNIsolone  (MEDROL  DOSPACK) 4 MG tablet follow package directions 10/07/13   Hyatt, Max T, DPM  ondansetron  (ZOFRAN ) 4 MG tablet Take 1 tablet (4 mg total) by mouth every 8 (eight) hours as needed for nausea or vomiting. 01/22/14   Diedra Rocky PARAS, MD    Allergies: Penicillins    Review of Systems  Constitutional:  Negative for fever.  Musculoskeletal:        Right middle finger pain    Updated Vital Signs BP (!) 142/92 (BP Location: Left Arm)   Pulse 73   Temp 98.2 F (36.8 C) (Oral)   Resp 16   Ht 5' 5 (1.651 m)   Wt 95.3 kg   SpO2 98%   Breastfeeding No   BMI 34.95 kg/m   Physical Exam Vitals and nursing note reviewed.  Constitutional:      Appearance: Normal appearance.  HENT:     Head: Atraumatic.  Cardiovascular:     Rate and Rhythm: Normal rate and regular rhythm.     Comments: Radial pulse 2+ bilaterally Pulmonary:     Effort: Pulmonary effort is normal.  Musculoskeletal:     Comments: Right Middle finger:  General Area of fluctuance at the nailbed base  of the right middle finger.  No drainage.  No erythema or open wounds.  No obvious deformity  Palpation Nontender to palpation of the proximal, middle, or distal phalanx.  She is tender over the soft tissue surrounding the right nailbed.  Nontender of the palmar aspect of the right middle finger/nontender over the flexor tendon sheath  ROM Full flexion and extension at the 1st-5th digit MCPs, PIPs, DIPs   Sensation: Reports diminished sensation in the right middle finger over the skin of the distal phalanx.  Reports this is from prior injury.   Skin:    Comments: Cap refill under 2 seconds in the right middle fingers  Neurological:     General: No focal deficit present.     Mental Status: She is alert.  Psychiatric:        Mood and Affect: Mood normal.        Behavior: Behavior normal.     (all labs ordered are listed, but only abnormal results are displayed) Labs Reviewed - No data to display  EKG: None  Radiology: DG Finger Middle Right Result Date: 11/03/2023 CLINICAL DATA:  Status post trauma. EXAM: RIGHT MIDDLE FINGER 2+V COMPARISON:  None Available. FINDINGS: There is no evidence  of fracture or dislocation. There is no evidence of arthropathy or other focal bone abnormality. Mild dorsal soft tissue swelling is seen along the distal aspect of the third right finger. IMPRESSION: Mild dorsal soft tissue swelling without evidence of acute fracture or dislocation. Electronically Signed   By: Suzen Dials M.D.   On: 11/03/2023 14:48     .Incision and Drainage  Date/Time: 11/03/2023 4:13 PM  Performed by: Veta Palma, PA-C Authorized by: Veta Palma, PA-C   Consent:    Consent obtained:  Verbal   Consent given by:  Patient   Risks, benefits, and alternatives were discussed: yes     Risks discussed:  Bleeding, incomplete drainage, pain and infection   Alternatives discussed:  No treatment Universal protocol:    Procedure explained and questions answered  to patient or proxy's satisfaction: yes     Imaging studies available: yes     Patient identity confirmed:  Verbally with patient Location:    Type:  Abscess (Paronychia)   Location: Right middle finger. Pre-procedure details:    Skin preparation:  Chlorhexidine Sedation:    Sedation type:  None Anesthesia:    Anesthesia method:  Nerve block   Block anesthetic:  Lidocaine  1% w/o epi   Block injection procedure:  Anatomic landmarks identified, introduced needle, incremental injection, anatomic landmarks palpated and negative aspiration for blood   Block outcome:  Anesthesia achieved Procedure type:    Complexity:  Simple Procedure details:    Incision types:  Single straight   Incision depth:  Dermal   Drainage:  Purulent   Drainage amount:  Moderate   Wound treatment:  Wound left open Post-procedure details:    Procedure completion:  Tolerated well, no immediate complications Comments:     Gar Pais PA student performed digital nerve block of right middle finger with my supervision and patient's consent.  Complete nerve block obtained.  I performed I&D of paronychia    Medications Ordered in the ED  lidocaine  (PF) (XYLOCAINE ) 1 % injection 10 mL (10 mLs Infiltration Given 11/03/23 1504)  ibuprofen  (ADVIL ) tablet 600 mg (600 mg Oral Given 11/03/23 1509)                                    Medical Decision Making Amount and/or Complexity of Data Reviewed Radiology: ordered.  Risk Prescription drug management.     Differential diagnosis includes but is not limited to fracture, dislocation, sprain, flexor tenosynovitis, paronychia, felon, cellulitis, nerve injury  ED Course:  Upon initial evaluation, patient is well appearing.  Has area of fluctuance around the right middle finger nailbed. No drainage. This is consistent with paronychia. No tenderness or edema of the pulp of the right middle finger, low concern for felon. No tenderness of the flexor tendon sheath, able  to fully flex and extend of the MCP, PIP, DIP of the right middle finger, low concern for flexor tenosynovitis.  Low concern for tendon injury.  She reports baseline sensation, although diminished, to the right middle finger.  States this is from a previous injury and this is not something new.  She has cap refill under 2 seconds in the right middle finger.  She did report hitting her finger, will rule out fracture with x-rays.   Imaging Studies ordered: I ordered imaging studies including x-ray right middle finger I independently visualized the imaging with scope of interpretation limited to determining acute life threatening conditions  related to emergency care. Imaging showed dorsal soft tissue swelling without any acute bony abnormality I agree with the radiologist interpretation    Medications Given: Lidocaine  Ibuprofen    X-ray imaging was reviewed which revealed no retained foreign body, fracture, or dislocation.  Suspect her pain is secondary to the paronychia.  Patient's right middle finger was cleaned with chlorhexidine swabs. Anesthesia achieved with digital nerve block of the right middle finger with lidocaine .  I&D was performed of patient's right middle finger paronychia, moderate amount of purulent drainage expelled.  Patient tolerated this well without any immediate complications.  Patient remains neurovascularly intact after incision and drainage. Patient stable and appropriate for discharge home.    Impression: Right middle finger paronychia   Disposition:  The patient was discharged home with instructions to keep area clean with soap and water.  Allow incision site to heal over its own.  May take Tylenol and ibuprofen  as needed for pain at home.  Follow-up with PCP if symptoms not improving within the next 3 days. Return precautions given.    This chart was dictated using voice recognition software, Dragon. Despite the best efforts of this provider to proofread and  correct errors, errors may still occur which can change documentation meaning.      Final diagnoses:  Paronychia of right middle finger    ED Discharge Orders     None          Veta Palma, [place] 11/03/23 1619    Jerrol Agent, MD 11/04/23 [phone]

## 2023-11-03 NOTE — ED Triage Notes (Signed)
 Pt c/o R distal middle finger swelling x 2 days. Denies drainage, fever.

## 2023-11-03 NOTE — Discharge Instructions (Addendum)
 The x-ray of your right middle finger did not show any evidence of a fracture or dislocation.  You had your paronychia (finger infection) drained here today.  There is a small laceration (cut) where it was drained.  This will heal on its own. You may gently clean the area around your laceration as needed with soap and water.   Do not submerge your laceration in water (no baths, swimming) until it is fully healed (about 5 days). You may shower.   Please allow the area to keep draining if it is still doing so.  Please apply warm compresses to the area 2-3 times a day for 10 minutes at a time to help promote drainage.  Take 600mg  ibuprofen , then 3 hours later take 1000mg  tylenol, then 3 hours later 600mg  ibuprofen , then 3 hours later 1000mg  tylenol, so on and so forth for pain control.  You were given your first dose of ibuprofen  here today.  Please follow-up with your PCP if your symptoms have not started to improve within the next 3 days.  Return to the ER should you develop fever, chills, redness around your wound, any other new or concerning symptoms
